# Patient Record
Sex: Female | Born: 1990 | Race: White | Hispanic: No | Marital: Single | Smoking: Current every day smoker
Health system: Southern US, Community
[De-identification: ages and names within clinical notes are randomized; demographics above are authoritative.]

## PROBLEM LIST (undated history)

## (undated) DIAGNOSIS — F419 Anxiety disorder, unspecified: Secondary | ICD-10-CM

## (undated) DIAGNOSIS — F32A Depression, unspecified: Secondary | ICD-10-CM

## (undated) DIAGNOSIS — F329 Major depressive disorder, single episode, unspecified: Secondary | ICD-10-CM

## (undated) HISTORY — DX: Major depressive disorder, single episode, unspecified: F32.9

## (undated) HISTORY — DX: Depression, unspecified: F32.A

## (undated) HISTORY — DX: Anxiety disorder, unspecified: F41.9

---

## 2010-11-06 ENCOUNTER — Ambulatory Visit (HOSPITAL_COMMUNITY)
Admission: RE | Admit: 2010-11-06 | Discharge: 2010-11-06 | Disposition: A | Discharge status: Home / Self Care | Payer: BC Managed Care – PPO | Source: Ambulatory Visit | Attending: Family Medicine | Admitting: Family Medicine

## 2010-11-06 ENCOUNTER — Other Ambulatory Visit: Payer: Self-pay | Admitting: Family Medicine

## 2010-11-06 DIAGNOSIS — R1031 Right lower quadrant pain: Secondary | ICD-10-CM | POA: Insufficient documentation

## 2010-11-06 MED ORDER — IOHEXOL 300 MG/ML  SOLN
100.0000 mL | Freq: Once | INTRAMUSCULAR | Status: AC | PRN
  Administered 2010-11-06: 100 mL via INTRAVENOUS

## 2012-05-08 IMAGING — CT CT ABD-PELV W/ CM
2 of 3 series · 17 of 46 positions shown, 19 images · IV contrast (Omnipaque 300)
Comparison: None

CLINICAL DATA: Right lower quadrant pain.

CT ABDOMEN AND PELVIS WITH CONTRAST
TECHNIQUE: Multidetector CT imaging of the abdomen and pelvis was
performed following the standard protocol during bolus
administration of intravenous contrast.
Contrast: 100mL OMNIPAQUE IOHEXOL 300 MG/ML IV SOLN

[Series 2: abd_pel_with 5.0 b40f · axial · 0.69mm/px · z∈[-481,-96]mm · 14 of 89 slices shown, 16 images]
[im 6/89  soft-tissue]
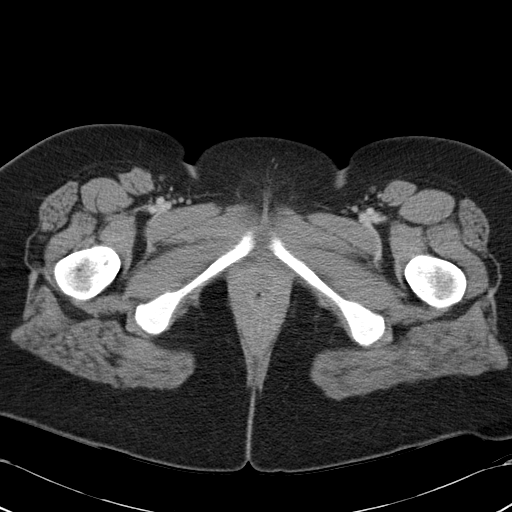
[im 6/89  bone]
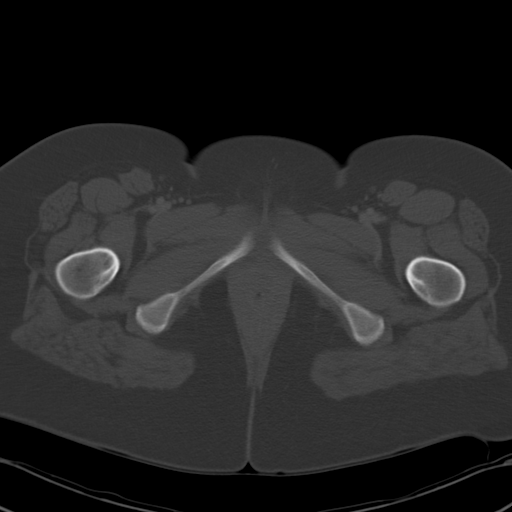
[im 12/89  soft-tissue]
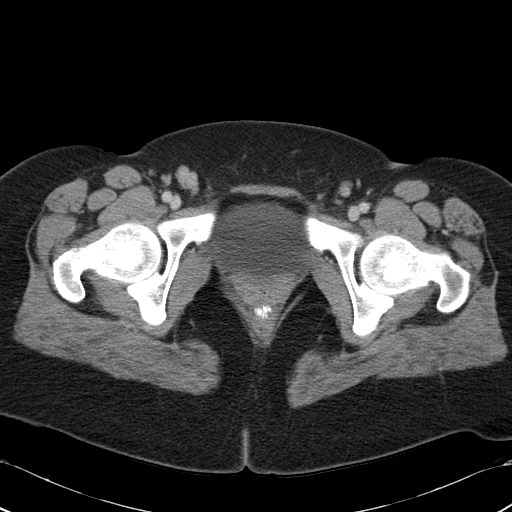
[im 18/89  soft-tissue]
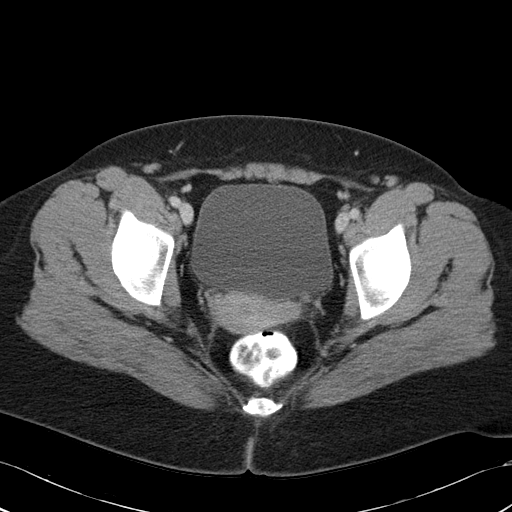
[im 23/89  soft-tissue]
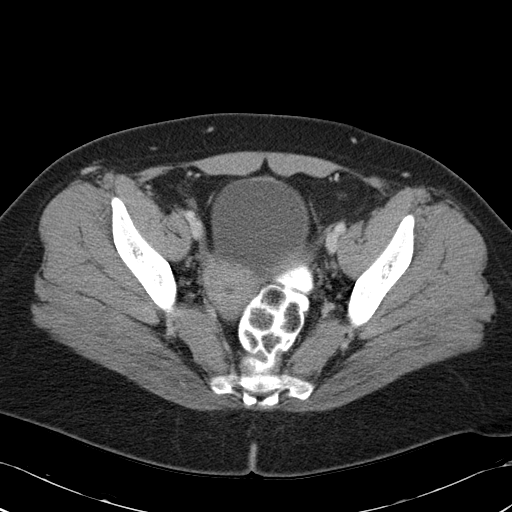
[im 29/89  soft-tissue]
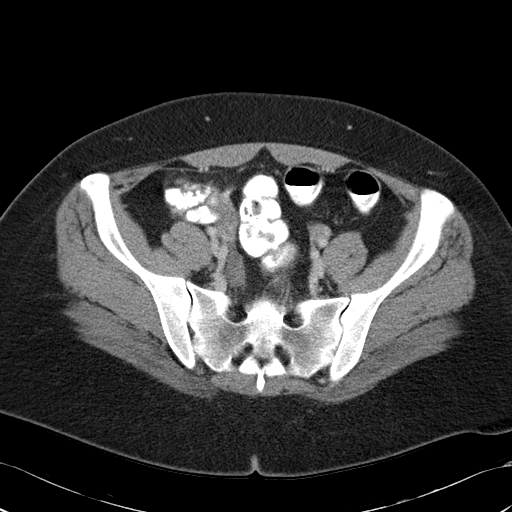
[im 35/89  soft-tissue]
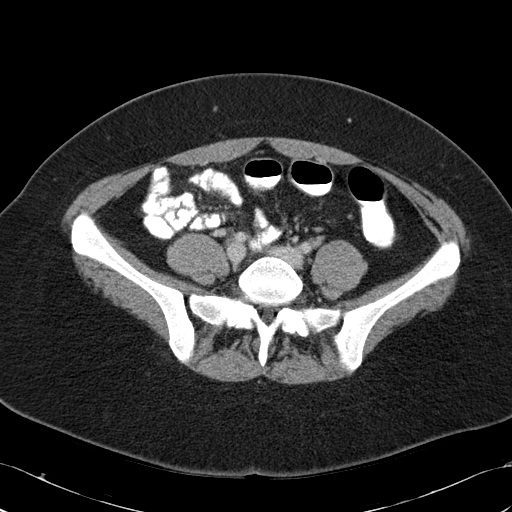
[im 40/89  soft-tissue]
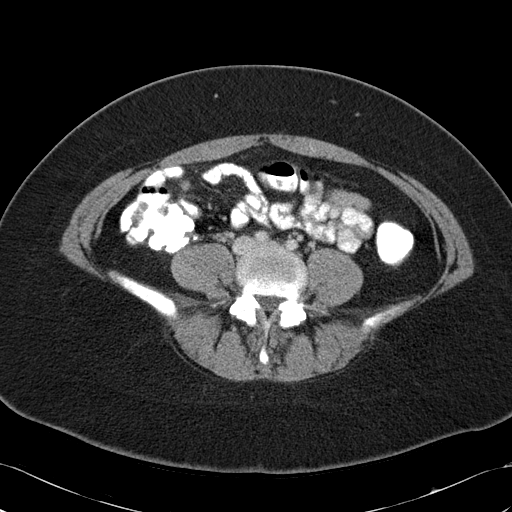
[im 49/89  soft-tissue]
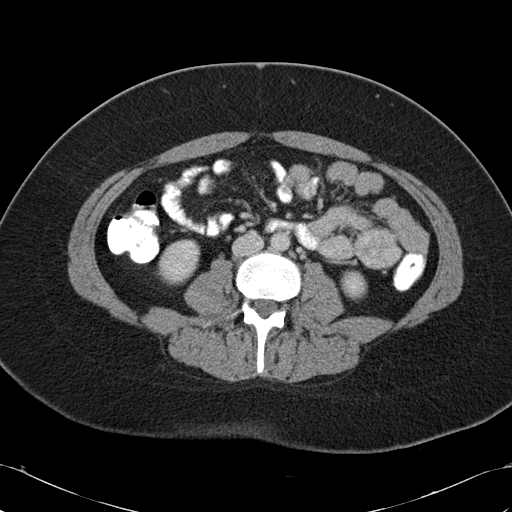
[im 54/89  soft-tissue]
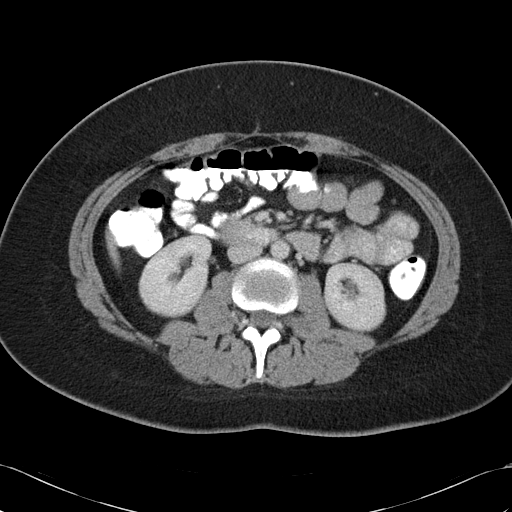
[im 54/89  bone]
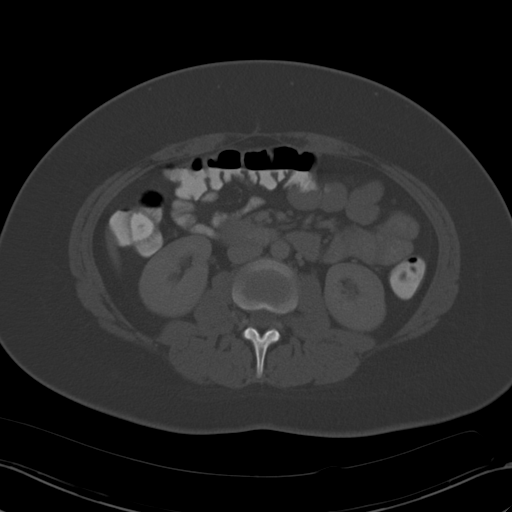
[im 60/89  soft-tissue]
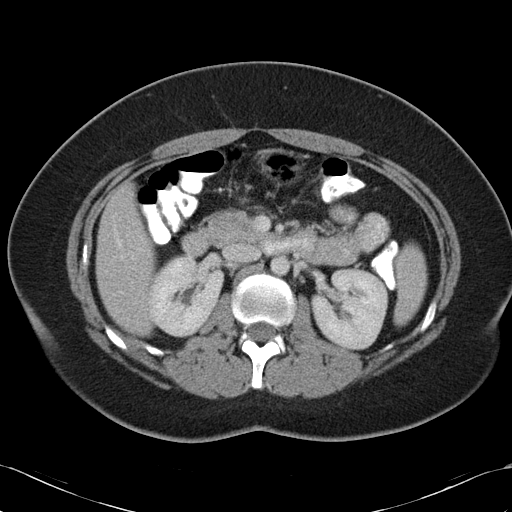
[im 66/89  soft-tissue]
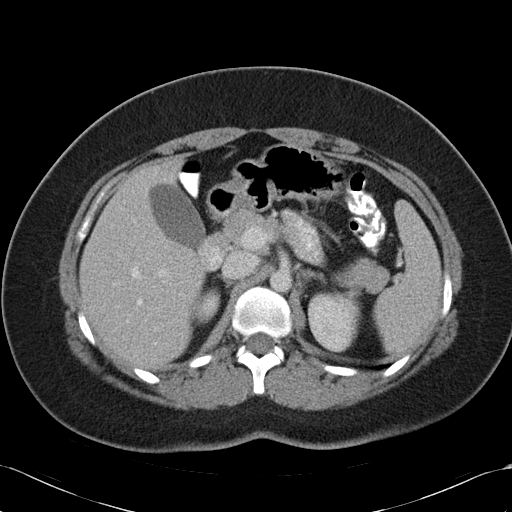
[im 71/89  soft-tissue]
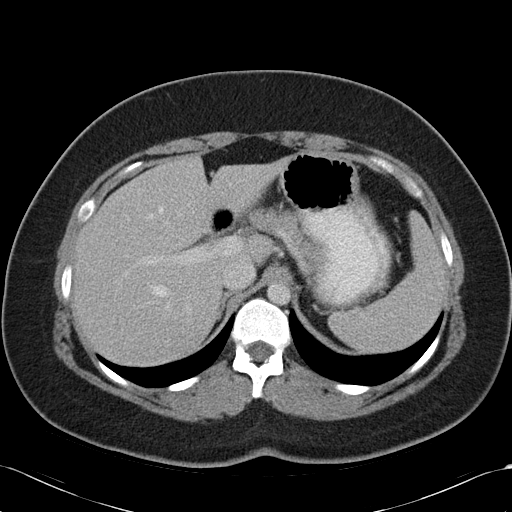
[im 77/89  soft-tissue]
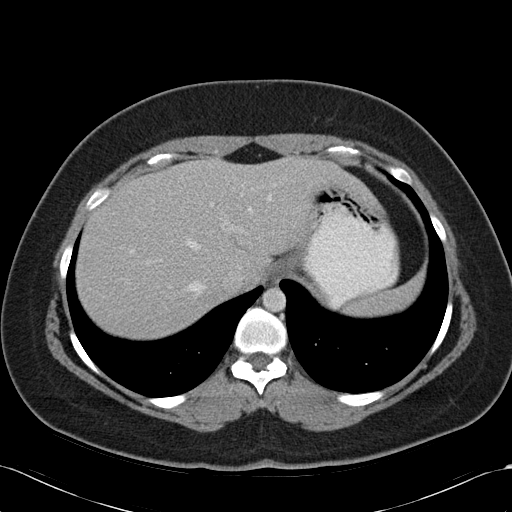
[im 83/89  soft-tissue]
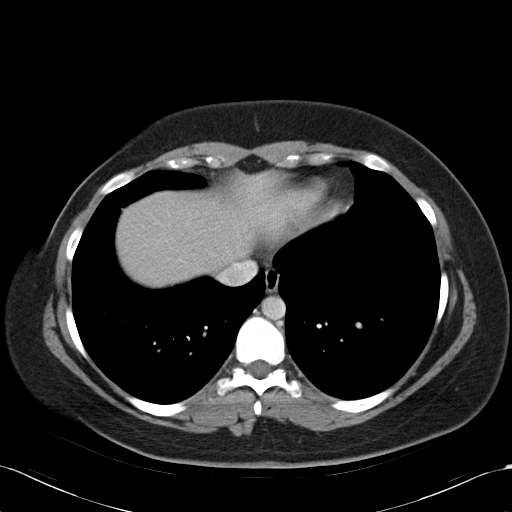

[Series 4: abd_pel_with 3.0 spo cor · coronal · 0.69mm/px · 3 of 77 slices shown]
[im 26/77  soft-tissue]
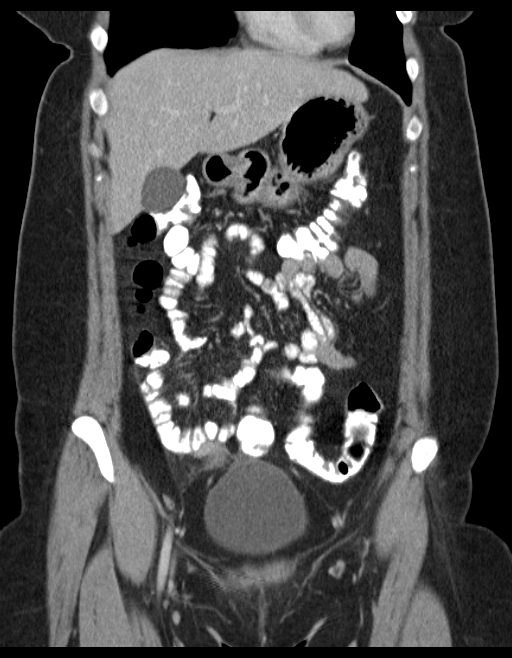
[im 34/77  soft-tissue]
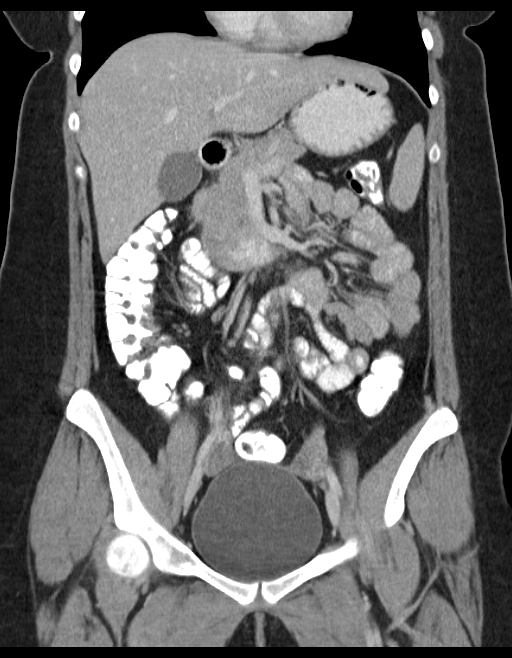
[im 43/77  soft-tissue]
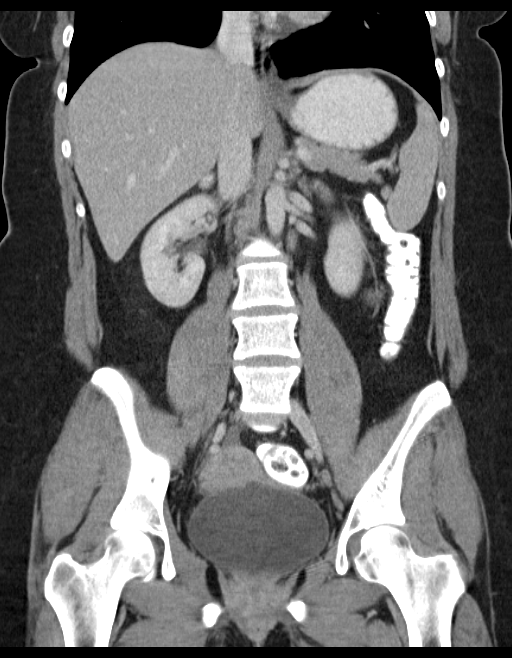

[17 of 46 positions shown; findings below may reference images not displayed]

FINDINGS: Lung bases are clear.  No effusions.  Heart is normal
size.

Liver, gallbladder, spleen, pancreas, adrenals and kidneys are
normal.  Appendix visualized and is normal.  Stomach, large and
small bowel are normal.  Uterus, adnexa urinary bladder grossly
unremarkable.  No free fluid, free air or adenopathy.  Aorta is
normal caliber.  No acute bony abnormality.
IMPRESSION: Normal appendix.

No acute findings in the abdomen or pelvis.

## 2012-06-05 ENCOUNTER — Other Ambulatory Visit: Payer: Self-pay | Admitting: Nurse Practitioner

## 2012-07-06 ENCOUNTER — Telehealth: Payer: Self-pay | Admitting: Nurse Practitioner

## 2012-07-11 ENCOUNTER — Other Ambulatory Visit: Payer: Self-pay | Admitting: *Deleted

## 2012-07-11 MED ORDER — CITALOPRAM HYDROBROMIDE 40 MG PO TABS: 40.0000 mg | ORAL_TABLET | Freq: Every day | ORAL | Status: DC

## 2012-07-11 NOTE — Telephone Encounter (Signed)
Done

## 2012-07-28 ENCOUNTER — Ambulatory Visit (INDEPENDENT_AMBULATORY_CARE_PROVIDER_SITE_OTHER): Payer: BC Managed Care – PPO | Admitting: General Practice

## 2012-07-28 ENCOUNTER — Encounter: Payer: Self-pay | Admitting: General Practice

## 2012-07-28 VITALS — BP 107/80 | HR 82 | Temp 98.8°F | Ht 62.0 in | Wt 166.0 lb

## 2012-07-28 DIAGNOSIS — F410 Panic disorder [episodic paroxysmal anxiety] without agoraphobia: Secondary | ICD-10-CM

## 2012-07-28 MED ORDER — CITALOPRAM HYDROBROMIDE 40 MG PO TABS: 40.0000 mg | ORAL_TABLET | Freq: Every day | ORAL | Status: DC

## 2012-07-28 NOTE — Progress Notes (Signed)
  Subjective:    Patient ID: Brooke Mcclure, female    DOB: December 28, 1990, 22 y.o.   MRN: [id number]  HPI Patient presents today for medications refill. She is taking celexa 40 mg daily. She reports her panic attacks have been well managed while taking this medications. Reports last menstrual cycle as two weeks ago.     Review of Systems  Constitutional: Negative for fever and chills.  Respiratory: Negative for chest tightness and shortness of breath.   Cardiovascular: Negative for chest pain and palpitations.  Gastrointestinal: Negative for nausea, vomiting, abdominal pain and blood in stool.  Genitourinary: Negative for difficulty urinating.  Neurological: Negative for dizziness, weakness and headaches.       Objective:   Physical Exam  Constitutional: She is oriented to person, place, and time. She appears well-developed and well-nourished.  HENT:  Head: Normocephalic and atraumatic.  Cardiovascular: Normal rate, regular rhythm and normal heart sounds.   Pulmonary/Chest: Effort normal and breath sounds normal.  Neurological: She is alert and oriented to person, place, and time.  Skin: Skin is warm and dry.  Psychiatric: She has a normal mood and affect.          Assessment & Plan:  1. Panic attacks - citalopram (CELEXA) 40 MG tablet; Take 1 tablet (40 mg total) by mouth daily.  Dispense: 30 tablet; Refill: 3 -discussed importance of identifying causes of panic attacks -RTO if symptoms develop -Patient verbalized understanding -Coralie Keens, FNP-C

## 2012-07-28 NOTE — Patient Instructions (Addendum)

## 2012-08-07 ENCOUNTER — Other Ambulatory Visit: Payer: Self-pay | Admitting: Family Medicine

## 2012-08-24 ENCOUNTER — Ambulatory Visit: Payer: Self-pay | Admitting: Family Medicine

## 2012-08-25 ENCOUNTER — Ambulatory Visit (INDEPENDENT_AMBULATORY_CARE_PROVIDER_SITE_OTHER): Payer: BC Managed Care – PPO | Admitting: Family Medicine

## 2012-08-25 ENCOUNTER — Encounter: Payer: Self-pay | Admitting: Family Medicine

## 2012-08-25 VITALS — BP 124/89 | HR 86 | Temp 99.5°F | Ht 62.0 in | Wt 167.8 lb

## 2012-08-25 DIAGNOSIS — E559 Vitamin D deficiency, unspecified: Secondary | ICD-10-CM

## 2012-08-25 DIAGNOSIS — R5381 Other malaise: Secondary | ICD-10-CM

## 2012-08-25 DIAGNOSIS — E538 Deficiency of other specified B group vitamins: Secondary | ICD-10-CM

## 2012-08-25 DIAGNOSIS — Z0000 Encounter for general adult medical examination without abnormal findings: Secondary | ICD-10-CM

## 2012-08-25 LAB — POCT CBC
Granulocyte percent: 73.5 %G (ref 37–80)
HCT, POC: 41.3 % (ref 37.7–47.9)
Hemoglobin: 14.2 g/dL (ref 12.2–16.2)
Lymph, poc: 1.5 (ref 0.6–3.4)
MCH, POC: 31.5 pg — AB (ref 27–31.2)
MCHC: 34.3 g/dL (ref 31.8–35.4)
MCV: 91.8 fL (ref 80–97)
MPV: 7.9 fL (ref 0–99.8)
POC Granulocyte: 4.7 (ref 2–6.9)
POC LYMPH PERCENT: 23.6 %L (ref 10–50)
Platelet Count, POC: 304 10*3/uL (ref 142–424)
RBC: 4.5 M/uL (ref 4.04–5.48)
RDW, POC: 12 %
WBC: 6.4 10*3/uL (ref 4.6–10.2)

## 2012-08-25 NOTE — Progress Notes (Signed)
  Subjective:    Patient ID: Brooke Mcclure, female    DOB: [birth year], 22 y.o.   MRN: [id number]  HPI This 22 y.o. female presents for evaluation of physical without pap smear.  She wants STD labs. She is a social drinker and smokes.  She has hx of GAD which is controlled with celexa.   She otherwise has no health concerns.   Review of Systems    No chest pain, SOB, HA, dizziness, vision change, N/V, diarrhea, constipation, dysuria, urinary urgency or frequency, myalgias, arthralgias or rash.  Objective:   Physical Exam Vital signs noted  Well developed well nourished female.  HEENT - Head atraumatic Normocephalic                Eyes - PERRLA, Conjuctiva - clear Sclera- Clear EOMI                Ears - EAC's Wnl TM's Wnl Gross Hearing WNL                Nose - Nares patent                 Throat - oropharanx wnl Respiratory - Lungs CTA bilateral Cardiac - RRR S1 and S2 without murmur GI - Abdomen soft Nontender and bowel sounds active x 4 Extremities - No edema. Neuro - Grossly intact.       Assessment & Plan:  Routine general medical examination at a health care facility - Plan: POCT CBC, CMP14+EGFR, Lipid panel, Thyroid Panel With TSH, HIV antibody, Vit D  25 hydroxy (rtn osteoporosis monitoring), Vitamin B12, RPR  B12 deficiency - Plan: Vitamin B12  Unspecified vitamin D deficiency - Plan: Vit D  25 hydroxy (rtn osteoporosis monitoring)  Other malaise and fatigue - Plan: POCT CBC, CMP14+EGFR, Lipid panel, Thyroid Panel With TSH

## 2012-08-25 NOTE — Patient Instructions (Signed)

## 2012-08-26 ENCOUNTER — Other Ambulatory Visit: Payer: Self-pay | Admitting: Family Medicine

## 2012-08-26 DIAGNOSIS — E538 Deficiency of other specified B group vitamins: Secondary | ICD-10-CM

## 2012-08-26 LAB — LIPID PANEL
Chol/HDL Ratio: 3.2 ratio units (ref 0.0–4.4)
Cholesterol, Total: 151 mg/dL (ref 100–189)
HDL: 47 mg/dL (ref >39)
LDL Calculated: 90 mg/dL (ref 0–119)
Triglycerides: 69 mg/dL (ref 0–114)
VLDL Cholesterol Cal: 14 mg/dL (ref 5–40)

## 2012-08-26 LAB — CMP14+EGFR
ALT: 12 IU/L (ref 0–32)
AST: 12 IU/L (ref 0–40)
Albumin/Globulin Ratio: 2 (ref 1.1–2.5)
Albumin: 4.7 g/dL (ref 3.5–5.5)
Alkaline Phosphatase: 80 IU/L (ref 39–117)
BUN/Creatinine Ratio: 15 (ref 8–20)
BUN: 12 mg/dL (ref 6–20)
CO2: 23 mmol/L (ref 18–29)
Calcium: 9.6 mg/dL (ref 8.7–10.2)
Chloride: 104 mmol/L (ref 97–108)
Creatinine, Ser: 0.79 mg/dL (ref 0.57–1.00)
GFR calc Af Amer: 121 mL/min/{1.73_m2} (ref >59)
GFR calc non Af Amer: 105 mL/min/{1.73_m2} (ref >59)
Globulin, Total: 2.3 g/dL (ref 1.5–4.5)
Glucose: 81 mg/dL (ref 65–99)
Potassium: 4.3 mmol/L (ref 3.5–5.2)
Sodium: 142 mmol/L (ref 134–144)
Total Bilirubin: 0.5 mg/dL (ref 0.0–1.2)
Total Protein: 7 g/dL (ref 6.0–8.5)

## 2012-08-26 LAB — THYROID PANEL WITH TSH
Free Thyroxine Index: 2.1 (ref 1.2–4.9)
T3 Uptake Ratio: 31 % (ref 24–39)
T4, Total: 6.7 ug/dL (ref 4.5–12.0)
TSH: 1.32 u[IU]/mL (ref 0.450–4.500)

## 2012-08-26 LAB — VITAMIN D 25 HYDROXY (VIT D DEFICIENCY, FRACTURES): Vit D, 25-Hydroxy: 28.7 ng/mL — ABNORMAL LOW (ref 30.0–100.0)

## 2012-08-26 LAB — HIV ANTIBODY (ROUTINE TESTING W REFLEX)
HIV 1/O/2 Abs-Index Value: <1 (ref <1.00)
HIV-1/HIV-2 Ab: NONREACTIVE

## 2012-08-26 LAB — RPR: RPR: NONREACTIVE

## 2012-08-26 LAB — VITAMIN B12: Vitamin B-12: 293 pg/mL (ref 211–946)

## 2012-08-26 MED ORDER — CYANOCOBALAMIN 1000 MCG/ML IJ SOLN: INTRAMUSCULAR | Status: DC

## 2012-12-05 ENCOUNTER — Other Ambulatory Visit: Payer: Self-pay

## 2012-12-05 DIAGNOSIS — F410 Panic disorder [episodic paroxysmal anxiety] without agoraphobia: Secondary | ICD-10-CM

## 2012-12-05 MED ORDER — CITALOPRAM HYDROBROMIDE 40 MG PO TABS: 40.0000 mg | ORAL_TABLET | Freq: Every day | ORAL | Status: DC

## 2012-12-05 NOTE — Telephone Encounter (Signed)
Last seen 08/25/12  B [place]

## 2013-03-08 ENCOUNTER — Ambulatory Visit (INDEPENDENT_AMBULATORY_CARE_PROVIDER_SITE_OTHER): Payer: BC Managed Care – PPO | Admitting: Nurse Practitioner

## 2013-03-08 ENCOUNTER — Encounter: Payer: Self-pay | Admitting: Nurse Practitioner

## 2013-03-08 VITALS — BP 115/78 | HR 84 | Temp 97.4°F | Ht 62.0 in | Wt 164.0 lb

## 2013-03-08 DIAGNOSIS — A084 Viral intestinal infection, unspecified: Secondary | ICD-10-CM

## 2013-03-08 DIAGNOSIS — A088 Other specified intestinal infections: Secondary | ICD-10-CM

## 2013-03-08 DIAGNOSIS — R109 Unspecified abdominal pain: Secondary | ICD-10-CM

## 2013-03-08 LAB — POCT CBC
GRANULOCYTE PERCENT: 70.8 % (ref 37–80)
HCT, POC: 39.3 % (ref 37.7–47.9)
HEMOGLOBIN: 12.9 g/dL (ref 12.2–16.2)
Lymph, poc: 1.5 (ref 0.6–3.4)
MCH, POC: 29.8 pg (ref 27–31.2)
MCHC: 32.9 g/dL (ref 31.8–35.4)
MCV: 90.5 fL (ref 80–97)
MPV: 7.8 fL (ref 0–99.8)
POC GRANULOCYTE: 4.5 (ref 2–6.9)
POC LYMPH %: 23.7 % (ref 10–50)
Platelet Count, POC: 247 10*3/uL (ref 142–424)
RBC: 4.3 M/uL (ref 4.04–5.48)
RDW, POC: 11.6 %
WBC: 6.3 10*3/uL (ref 4.6–10.2)

## 2013-03-08 NOTE — Progress Notes (Signed)
   Subjective:    Patient ID: Kendal HymenGabrielle Checca, female    DOB: [birth year], 23 y.o.   MRN: [id number]  HPI Patient presents today with stomach complaints. Symptoms began a few days ago and include diarrhea, nausea, vomiting, chills, dizziness, body aches, and stomach pain. Patient states appetite has decreased and goes immediately to the restroom once she eats. Treatment include Tylenol which provided moderate relief of pain. Denies urinary symptoms.    Review of Systems  Constitutional: Positive for fever, chills and appetite change.  Respiratory: Negative for shortness of breath.   Cardiovascular: Negative for chest pain.  Gastrointestinal: Positive for nausea, vomiting, abdominal pain and diarrhea. Negative for constipation.  Genitourinary: Negative.   Neurological: Positive for dizziness and light-headedness.       Objective:   Physical Exam  Constitutional: She appears well-developed and well-nourished.  Cardiovascular: Normal rate, regular rhythm and normal heart sounds.   Pulmonary/Chest: Effort normal and breath sounds normal.  Abdominal: Soft. Bowel sounds are normal. There is tenderness (Lower right and left quadrants).    BP 115/78  Pulse 84  Temp(Src) 97.4 F (36.3 C) (Oral)  Ht 5\' 2"  (1.575 m)  Wt 164 lb (74.39 kg)  BMI 29.99 kg/m2  Results for orders placed in visit on 03/08/13  POCT CBC      Result Value Ref Range   WBC 6.3  4.6 - 10.2 K/uL   Lymph, poc 1.5  0.6 - 3.4   POC LYMPH PERCENT 23.7  10 - 50 %L   POC Granulocyte 4.5  2 - 6.9   Granulocyte percent 70.8  37 - 80 %G   RBC 4.3  4.04 - 5.48 M/uL   Hemoglobin 12.9  12.2 - 16.2 g/dL   HCT, POC 13.239.3  44.037.7 - 47.9 %   MCV 90.5  80 - 97 fL   MCH, POC 29.8  27 - 31.2 pg   MCHC 32.9  31.8 - 35.4 g/dL   RDW, POC 10.211.6     Platelet Count, POC 247.0  142 - 424 K/uL   MPV 7.8  0 - 99.8 fL     Assessment & Plan:   1. Viral gastroenteritis   2. Abdominal pain, unspecified site    First 24 Hours-Clear  liquids  popsicles  Jello  gatorade  Sprite Second 24 hours-Add Full liquids ( Liquids you cant see through) Third 24 hours- Bland diet ( foods that are baked or broiled)  *avoiding fried foods and highly spiced foods* During these 3 days  Avoid milk, cheese, ice cream or any other dairy products  Avoid caffeine- REMEMBER Mt. Dew and Mello Yellow contain lots of caffeine You should eat and drink in  Frequent small volumes If no improvement in symptoms or worsen in 2-3 days should RETRUN TO OFFICE or go to ER!    Mary-Margaret Daphine DeutscherMartin, FNP

## 2013-03-08 NOTE — Patient Instructions (Signed)
First 24 Hours-Clear liquids  popsicles  Jello  gatorade  Sprite Second 24 hours-Add Full liquids ( Liquids you cant see through) Third 24 hours- Bland diet ( foods that are baked or broiled)  *avoiding fried foods and highly spiced foods* During these 3 days  Avoid milk, cheese, ice cream or any other dairy products  Avoid caffeine- REMEMBER Mt. Dew and Mello Yellow contain lots of caffeine Imodium AD OTC  You should eat and drink in  Frequent small volumes If no improvement in symptoms or worsen in 2-3 days should RETRUN TO OFFICE or go to ER!

## 2013-06-05 ENCOUNTER — Telehealth: Payer: Self-pay | Admitting: Nurse Practitioner

## 2013-06-05 NOTE — Telephone Encounter (Signed)
Patient was calling to check and see if she could take slim quick with her celexa. It recommends on the bottle not to use with a SSRI. I advised patient not to use and the pharmacist recommended the same thing yesterday.

## 2014-01-21 ENCOUNTER — Encounter: Payer: Self-pay | Admitting: Nurse Practitioner

## 2014-01-21 ENCOUNTER — Ambulatory Visit (INDEPENDENT_AMBULATORY_CARE_PROVIDER_SITE_OTHER): Payer: BLUE CROSS/BLUE SHIELD | Admitting: Nurse Practitioner

## 2014-01-21 VITALS — BP 126/86 | HR 87 | Temp 97.0°F | Ht 62.0 in | Wt 172.0 lb

## 2014-01-21 DIAGNOSIS — F32A Depression, unspecified: Secondary | ICD-10-CM

## 2014-01-21 DIAGNOSIS — F329 Major depressive disorder, single episode, unspecified: Secondary | ICD-10-CM

## 2014-01-21 MED ORDER — ESCITALOPRAM OXALATE 20 MG PO TABS: 20.0000 mg | ORAL_TABLET | Freq: Every day | ORAL | Status: DC

## 2014-01-21 NOTE — Patient Instructions (Signed)
Stress and Stress Management Stress is a normal reaction to life events. It is what you feel when life demands more than you are used to or more than you can handle. Some stress can be useful. For example, the stress reaction can help you catch the last bus of the day, study for a test, or meet a deadline at work. But stress that occurs too often or for too long can cause problems. It can affect your emotional health and interfere with relationships and normal daily activities. Too much stress can weaken your immune system and increase your risk for physical illness. If you already have a medical problem, stress can make it worse. CAUSES  All sorts of life events may cause stress. An event that causes stress for one person may not be stressful for another person. Major life events commonly cause stress. These may be positive or negative. Examples include losing your job, moving into a new home, getting married, having a baby, or losing a loved one. Less obvious life events may also cause stress, especially if they occur day after day or in combination. Examples include working long hours, driving in traffic, caring for children, being in debt, or being in a difficult relationship. SIGNS AND SYMPTOMS Stress may cause emotional symptoms including, the following:  Anxiety. This is feeling worried, afraid, on edge, overwhelmed, or out of control.  Anger. This is feeling irritated or impatient.  Depression. This is feeling sad, down, helpless, or guilty.  Difficulty focusing, remembering, or making decisions. Stress may cause physical symptoms, including the following:   Aches and pains. These may affect your head, neck, back, stomach, or other areas of your body.  Tight muscles or clenched jaw.  Low energy or trouble sleeping. Stress may cause unhealthy behaviors, including the following:   Eating to feel better (overeating) or skipping meals.  Sleeping too little, too much, or both.  Working  too much or putting off tasks (procrastination).  Smoking, drinking alcohol, or using drugs to feel better. DIAGNOSIS  Stress is diagnosed through an assessment by your health care provider. Your health care provider will ask questions about your symptoms and any stressful life events.Your health care provider will also ask about your medical history and may order blood tests or other tests. Certain medical conditions and medicine can cause physical symptoms similar to stress. Mental illness can cause emotional symptoms and unhealthy behaviors similar to stress. Your health care provider may refer you to a mental health professional for further evaluation.  TREATMENT  Stress management is the recommended treatment for stress.The goals of stress management are reducing stressful life events and coping with stress in healthy ways.  Techniques for reducing stressful life events include the following:  Stress identification. Self-monitor for stress and identify what causes stress for you. These skills may help you to avoid some stressful events.  Time management. Set your priorities, keep a calendar of events, and learn to say "no." These tools can help you avoid making too many commitments. Techniques for coping with stress include the following:  Rethinking the problem. Try to think realistically about stressful events rather than ignoring them or overreacting. Try to find the positives in a stressful situation rather than focusing on the negatives.  Exercise. Physical exercise can release both physical and emotional tension. The key is to find a form of exercise you enjoy and do it regularly.  Relaxation techniques. These relax the body and mind. Examples include yoga, meditation, tai chi, biofeedback, deep  breathing, progressive muscle relaxation, listening to music, being out in nature, journaling, and other hobbies. Again, the key is to find one or more that you enjoy and can do  regularly.  Healthy lifestyle. Eat a balanced diet, get plenty of sleep, and do not smoke. Avoid using alcohol or drugs to relax.  Strong support network. Spend time with family, friends, or other people you enjoy being around.Express your feelings and talk things over with someone you trust. Counseling or talktherapy with a mental health professional may be helpful if you are having difficulty managing stress on your own. Medicine is typically not recommended for the treatment of stress.Talk to your health care provider if you think you need medicine for symptoms of stress. HOME CARE INSTRUCTIONS  Keep all follow-up visits as directed by your health care provider.  Take all medicines as directed by your health care provider. SEEK MEDICAL CARE IF:  Your symptoms get worse or you start having new symptoms.  You feel overwhelmed by your problems and can no longer manage them on your own. SEEK IMMEDIATE MEDICAL CARE IF:  You feel like hurting yourself or someone else. Document Released: 06/16/2000 Document Revised: 05/07/2013 Document Reviewed: 08/15/2012 Exit[employer] Patient Information 2015 [employer]. This information is not intended to replace advice given to you by your health care provider. Make sure you discuss any questions you have with your health care provider.  

## 2014-01-21 NOTE — Progress Notes (Signed)
   Subjective:    Patient ID: Brooke Mcclure, female    DOB: 02-Jan-1991, [age] y.o.   MRN: [id number]  HPI Patient in today for recheck of meds. SHe is currently on celexa for depressive disorder. Patient has been on this for 3 years and says that she is starting to feel depressed again. Would like to try something else- Has been on zoloft  And wellbutrin in the past which did not help.    Review of Systems  Constitutional: Negative.   HENT: Negative.   Respiratory: Negative.   Cardiovascular: Negative.   Gastrointestinal: Negative.   Genitourinary: Negative.   Neurological: Negative.   Psychiatric/Behavioral: Negative.   All other systems reviewed and are negative.      Objective:   Physical Exam  Constitutional: She is oriented to person, place, and time. She appears well-developed and well-nourished.  Cardiovascular: Normal rate, regular rhythm and normal heart sounds.   Pulmonary/Chest: Effort normal and breath sounds normal.  Neurological: She is alert and oriented to person, place, and time.  Skin: Skin is warm and dry.  Psychiatric: She has a normal mood and affect. Her behavior is normal. Judgment and thought content normal.   BP 126/86 mmHg  Pulse 87  Temp(Src) 97 F (36.1 C) (Oral)  Ht 5\' 2"  (1.575 m)  Wt 172 lb (78.019 kg)  BMI 31.45 kg/m2        Assessment & Plan:   1. Depression    Meds ordered this encounter  Medications  . escitalopram (LEXAPRO) 20 MG tablet    Sig: Take 1 tablet (20 mg total) by mouth daily.    Dispense:  30 tablet    Refill:  1    Order Specific Question:  Supervising Provider    Answer:  Ernestina PennaMOORE, DONALD W [[id number]]  side effects reviewed Stress management rto in 3 weeks follow up   Brooke Daphine DeutscherMartin, FNP

## 2014-02-11 ENCOUNTER — Ambulatory Visit (INDEPENDENT_AMBULATORY_CARE_PROVIDER_SITE_OTHER): Payer: BLUE CROSS/BLUE SHIELD | Admitting: Nurse Practitioner

## 2014-02-11 ENCOUNTER — Encounter: Payer: Self-pay | Admitting: Nurse Practitioner

## 2014-02-11 VITALS — BP 116/80 | HR 81 | Temp 98.5°F | Ht 62.0 in | Wt 169.0 lb

## 2014-02-11 DIAGNOSIS — F32A Depression, unspecified: Secondary | ICD-10-CM | POA: Insufficient documentation

## 2014-02-11 DIAGNOSIS — F411 Generalized anxiety disorder: Secondary | ICD-10-CM

## 2014-02-11 DIAGNOSIS — F329 Major depressive disorder, single episode, unspecified: Secondary | ICD-10-CM | POA: Insufficient documentation

## 2014-02-11 DIAGNOSIS — R5383 Other fatigue: Secondary | ICD-10-CM

## 2014-02-11 MED ORDER — VILAZODONE HCL 20 MG PO TABS: 1.0000 | ORAL_TABLET | Freq: Every day | ORAL | Status: DC

## 2014-02-11 NOTE — Progress Notes (Signed)
° °  Subjective:    Patient ID: Brooke Mcclure, female    DOB: January 24, 1990, 24 y.o.   MRN: [id number]  HPI Patient is here today for following on antidepression medication. She was started on lexapro20mg  on 01/21/14. She reports feeling the same. No improvement with the new medication. She reports feeling fatigue. Denies decrease in appetite, reports good sleeping habits.    Review of Systems  Constitutional: Negative.   HENT: Negative.   Eyes: Negative.   Respiratory: Negative.   Cardiovascular: Negative.   Gastrointestinal: Negative.   Endocrine: Negative.   Genitourinary: Negative.   Musculoskeletal: Negative.   Allergic/Immunologic: Negative.   Neurological: Negative.   Psychiatric/Behavioral:       Fatigue, depressed.   All other systems reviewed and are negative.      Objective:   Physical Exam  Constitutional: She is oriented to person, place, and time. She appears well-developed and well-nourished.  HENT:  Head: Normocephalic and atraumatic.  Eyes: Pupils are equal, round, and reactive to light.  Neck: Normal range of motion.  Cardiovascular: Normal rate and regular rhythm.   Pulmonary/Chest: Effort normal.  Abdominal: Soft.  Musculoskeletal: Normal range of motion.  Neurological: She is alert and oriented to person, place, and time.  Skin: Skin is warm.  Psychiatric: She has a normal mood and affect.    BP 116/80 mmHg   Pulse 81   Temp(Src) 98.5 F (36.9 C) (Oral)   Ht 5\' 2"  (1.575 m)   Wt 169 lb (76.658 kg)   BMI 30.90 kg/m2       Assessment & Plan:   1. Other fatigue   2. Depression   3. Anxiety, generalized    Orders Placed This Encounter  Procedures   Thyroid Panel With TSH   Meds ordered this encounter  Medications   Vilazodone HCl (VIIBRYD) 20 MG TABS    Sig: Take 1 tablet (20 mg total) by mouth daily.    Dispense:  30 tablet    Refill:  0    Order Specific Question:  Supervising Provider    Answer:  Deborra MedinaMOORE, DONALD W [[id number]]     Stress  management Encouraged to journal Follow up in 3 weeks PRN   Mary-Margaret Daphine DeutscherMartin, FNP

## 2014-02-11 NOTE — Patient Instructions (Signed)
Generalized Anxiety Disorder Generalized anxiety disorder (GAD) is a mental disorder. It interferes with life functions, including relationships, work, and school. GAD is different from normal anxiety, which everyone experiences at some point in their lives in response to specific life events and activities. Normal anxiety actually helps us prepare for and get through these life events and activities. Normal anxiety goes away after the event or activity is over.  GAD causes anxiety that is not necessarily related to specific events or activities. It also causes excess anxiety in proportion to specific events or activities. The anxiety associated with GAD is also difficult to control. GAD can vary from mild to severe. People with severe GAD can have intense waves of anxiety with physical symptoms (panic attacks).  SYMPTOMS The anxiety and worry associated with GAD are difficult to control. This anxiety and worry are related to many life events and activities and also occur more days than not for 6 months or longer. People with GAD also have three or more of the following symptoms (one or more in children):  Restlessness.   Fatigue.  Difficulty concentrating.   Irritability.  Muscle tension.  Difficulty sleeping or unsatisfying sleep. DIAGNOSIS GAD is diagnosed through an assessment by your health care provider. Your health care provider will ask you questions aboutyour mood,physical symptoms, and events in your life. Your health care provider may ask you about your medical history and use of alcohol or drugs, including prescription medicines. Your health care provider may also do a physical exam and blood tests. Certain medical conditions and the use of certain substances can cause symptoms similar to those associated with GAD. Your health care provider may refer you to a mental health specialist for further evaluation. TREATMENT The following therapies are usually used to treat GAD:    Medication. Antidepressant medication usually is prescribed for long-term daily control. Antianxiety medicines may be added in severe cases, especially when panic attacks occur.   Talk therapy (psychotherapy). Certain types of talk therapy can be helpful in treating GAD by providing support, education, and guidance. A form of talk therapy called cognitive behavioral therapy can teach you healthy ways to think about and react to daily life events and activities.  Stress managementtechniques. These include yoga, meditation, and exercise and can be very helpful when they are practiced regularly. A mental health specialist can help determine which treatment is best for you. Some people see improvement with one therapy. However, other people require a combination of therapies. Document Released: 04/17/2012 Document Revised: 05/07/2013 Document Reviewed: 04/17/2012 Exit[employer] Patient Information 2015 [employer]. This information is not intended to replace advice given to you by your health care provider. Make sure you discuss any questions you have with your health care provider.  

## 2014-03-06 ENCOUNTER — Ambulatory Visit: Payer: BLUE CROSS/BLUE SHIELD | Admitting: Nurse Practitioner

## 2014-03-24 ENCOUNTER — Other Ambulatory Visit: Payer: Self-pay | Admitting: Nurse Practitioner

## 2014-05-25 ENCOUNTER — Other Ambulatory Visit: Payer: Self-pay | Admitting: Nurse Practitioner

## 2014-06-24 ENCOUNTER — Other Ambulatory Visit: Payer: Self-pay | Admitting: Nurse Practitioner

## 2014-06-24 NOTE — Telephone Encounter (Signed)
Last seen 02/11/14  MMM 

## 2014-07-27 ENCOUNTER — Other Ambulatory Visit: Payer: Self-pay | Admitting: Nurse Practitioner

## 2014-08-28 ENCOUNTER — Other Ambulatory Visit: Payer: Self-pay | Admitting: Nurse Practitioner

## 2014-08-28 NOTE — Telephone Encounter (Signed)
Last seen 02/11/14  MMM 

## 2014-09-29 ENCOUNTER — Other Ambulatory Visit: Payer: Self-pay | Admitting: Nurse Practitioner

## 2014-09-30 ENCOUNTER — Telehealth: Payer: Self-pay | Admitting: Nurse Practitioner

## 2014-09-30 MED ORDER — ESCITALOPRAM OXALATE 20 MG PO TABS: ORAL_TABLET | ORAL | Status: DC

## 2014-09-30 NOTE — Telephone Encounter (Signed)
done

## 2014-10-10 ENCOUNTER — Ambulatory Visit: Payer: BLUE CROSS/BLUE SHIELD | Admitting: Nurse Practitioner

## 2014-10-15 ENCOUNTER — Ambulatory Visit (INDEPENDENT_AMBULATORY_CARE_PROVIDER_SITE_OTHER): Payer: BLUE CROSS/BLUE SHIELD | Admitting: Family Medicine

## 2014-10-15 ENCOUNTER — Encounter: Payer: Self-pay | Admitting: Family Medicine

## 2014-10-15 VITALS — BP 123/79 | HR 84 | Temp 98.0°F | Ht 62.0 in | Wt 167.4 lb

## 2014-10-15 DIAGNOSIS — F329 Major depressive disorder, single episode, unspecified: Secondary | ICD-10-CM | POA: Diagnosis not present

## 2014-10-15 DIAGNOSIS — H532 Diplopia: Secondary | ICD-10-CM

## 2014-10-15 DIAGNOSIS — F411 Generalized anxiety disorder: Secondary | ICD-10-CM

## 2014-10-15 DIAGNOSIS — F32A Depression, unspecified: Secondary | ICD-10-CM

## 2014-10-15 MED ORDER — ESCITALOPRAM OXALATE 20 MG PO TABS: ORAL_TABLET | ORAL | Status: AC

## 2014-10-15 NOTE — Progress Notes (Signed)
   HPI  Patient presents today for evaluation of depression and anxiety.  She states that she's tried several medications including Zoloft,Vybrid, celexa, and wellbutrin without improvement or with negative side effects  She denies SI, She sleeps well and has + fam Hx for depression and anxiety.  She states that her mood is been pretty well controlled until about 1 month ago when it seemed to get more anxious due to her wedding.  She also notes a head cold with some congestion that is improving. She reports diplopia intermittently over the last week or so. She states it is currently happening  PMH: Smoking status noted ROS: Per HPI  Objective: BP 123/79 mmHg  Pulse 84  Temp(Src) 98 F (36.7 C) (Oral)  Ht  (1.575 m)  Wt 167 lb 6.4 oz (75.932 kg)  BMI 30.61 kg/m2 Gen: NAD, alert, cooperative with exam HEENT: NCAT, no sinus tenderness, EOMI CV: RRR, good S1/S2, no murmur Resp: CTABL, no wheezes, non-labored Ext: No edema, warm Neuro: Alert and oriented, strength 5/5 and sensation intact in all 4 extremities, 2+ patellar tendon reflexes, EOMI, normal gait  Mood screening tools:  PHQ-9 score: 24, No SI GAD-7 Score: 21   Assessment and plan:  # Mood disorder, mixed anxiety and depression Continue Lexapro for now, if no improvement within the month would consider change to Effexor. No SI  # Diplopia Unclear etiology, hernial exam is normal and she has no extraocular movement abnormalities. No headache or other red flags for serious neurologic etiology Recommended watch and wait for another week, this is not improved or if it worsens, or if she develops signs of stroke which I detailed to her, then she should return or seek medical attention as appropriate.   Meds ordered this encounter  Medications  . escitalopram (LEXAPRO) 20 MG tablet    Sig: TAKE 1 TABLET (20 MG TOTAL) BY MOUTH DAILY.    Dispense:  30 tablet    Refill:  5    Murtis Sink, MD [place]  [facility] 10/15/2014, 5:11 PM

## 2014-10-15 NOTE — Patient Instructions (Signed)
Great to meet you!  Come back in 2 months to dsiscuss mood  If you develop sudden severe headache, weakness on one side of the body or the other, difficulty walking, or dificulty talking please seek immediate emergency care  If your double vision doesn't resolve within a week, please come back.

## 2014-10-19 ENCOUNTER — Telehealth: Payer: Self-pay | Admitting: Pediatrics

## 2014-10-19 NOTE — Telephone Encounter (Signed)
Called about wanting to stop lexapro but wanting to do it safely. Has been on multiple medicines for depression/anxiety in the past. For past week has been crying a lot more than usual, just doesn't feel like her normal self. Has been on the lexapro for several months. She does feel safe at home, has not had any thoughts of hurting herself or anyone else. A friend is coming to stay with her tonight. She is wondering if coming completely off of lexapro and doing counseling alone might help. Recommended she start cutting her lexapro 20mg  tablet in half, taking 10 mg daily until she can be seen in clinic this week. She will call back for an appointment on Monday with Gennette PacMary Margaret or Dr. Ermalinda MemosBradshaw as she has seen both of them before.

## 2014-10-26 ENCOUNTER — Other Ambulatory Visit: Payer: Self-pay | Admitting: Family Medicine

## 2014-10-28 ENCOUNTER — Other Ambulatory Visit: Payer: Self-pay | Admitting: Family Medicine

## 2014-12-09 ENCOUNTER — Telehealth: Payer: Self-pay | Admitting: Nurse Practitioner

## 2020-02-04 ENCOUNTER — Other Ambulatory Visit: Payer: Self-pay | Admitting: Obstetrics

## 2020-02-04 DIAGNOSIS — N939 Abnormal uterine and vaginal bleeding, unspecified: Secondary | ICD-10-CM

## 2020-02-15 ENCOUNTER — Ambulatory Visit
Admission: RE | Admit: 2020-02-15 | Discharge: 2020-02-15 | Disposition: A | Discharge status: Home / Self Care | Payer: BC Managed Care – PPO | Source: Ambulatory Visit | Attending: Obstetrics | Admitting: Obstetrics

## 2020-02-15 DIAGNOSIS — N939 Abnormal uterine and vaginal bleeding, unspecified: Secondary | ICD-10-CM

## 2021-08-17 IMAGING — US US PELVIS COMPLETE WITH TRANSVAGINAL
1 series · 14 of 25 positions shown · non-contrast
Comparison: None

CLINICAL DATA: Abnormal uterine bleeding



[Series 1: us pelvis complete with transvaginal · 0.20mm/px · 14 of 69 slices shown]
[im 1/69]
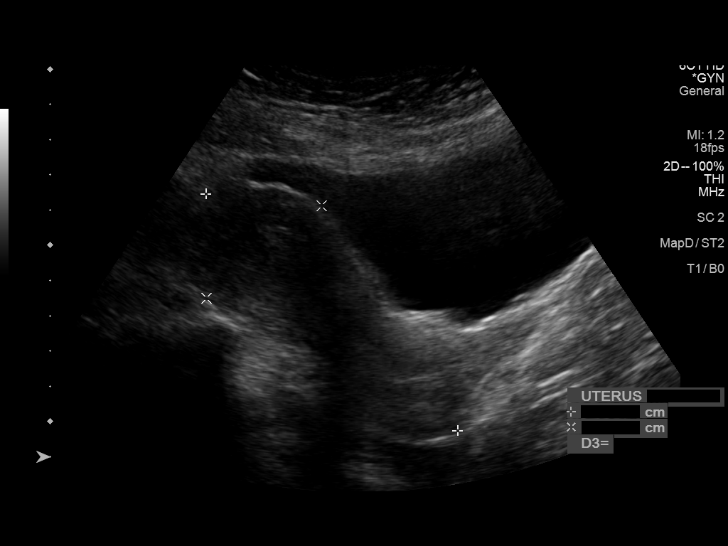
[im 6/69]
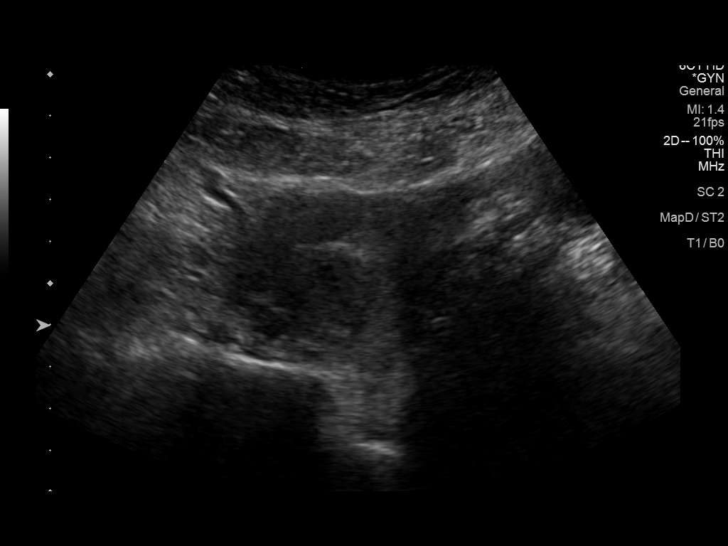
[im 12/69]
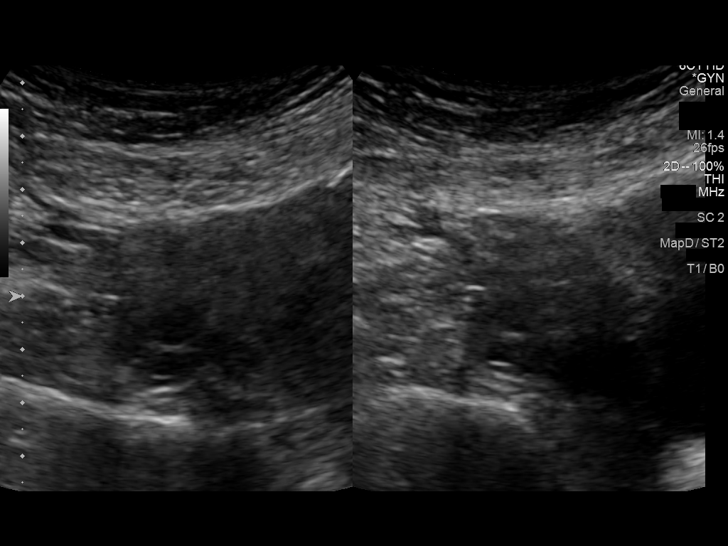
[im 18/69]
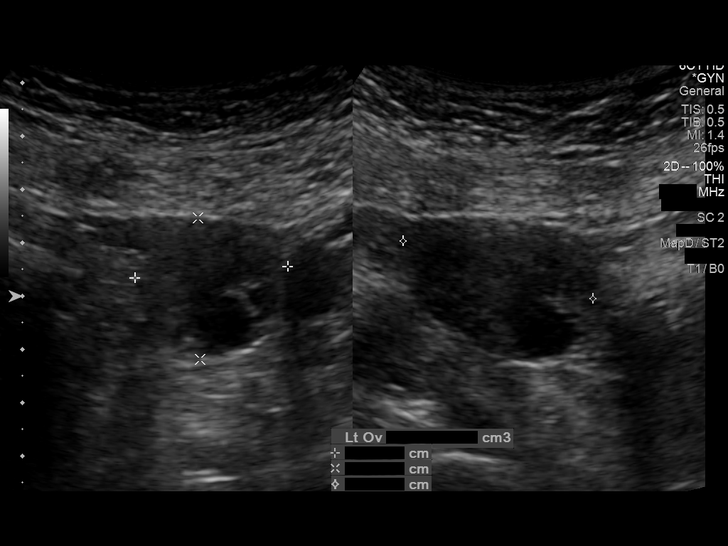
[im 23/69]
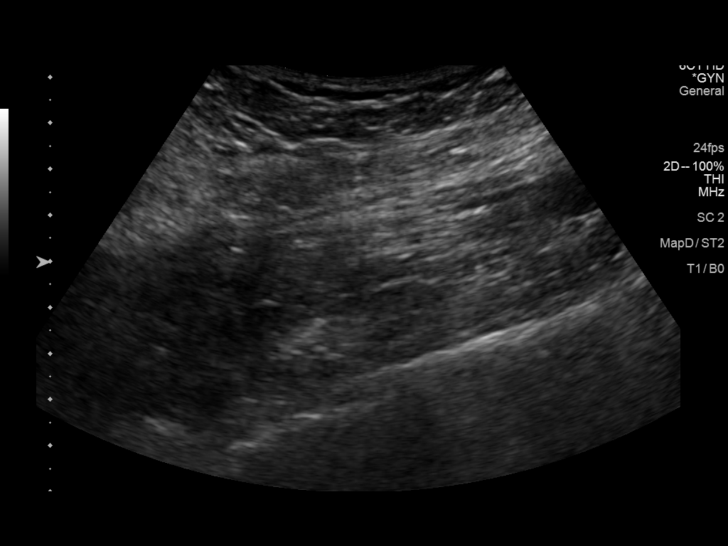
[im 26/69]
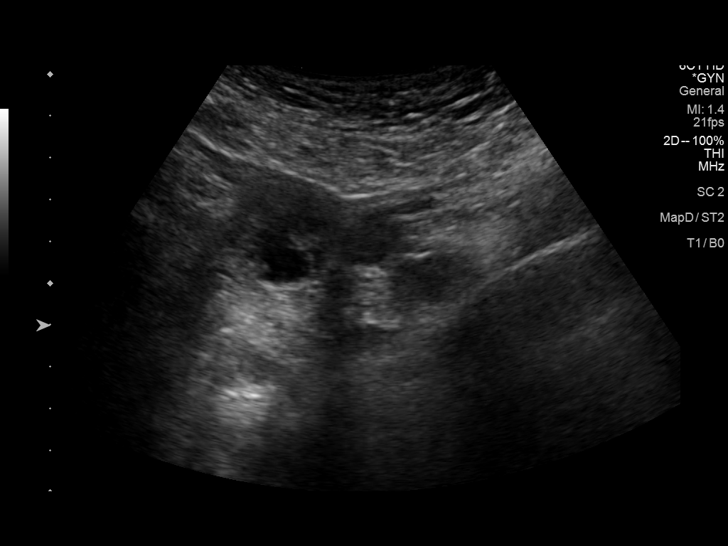
[im 32/69]
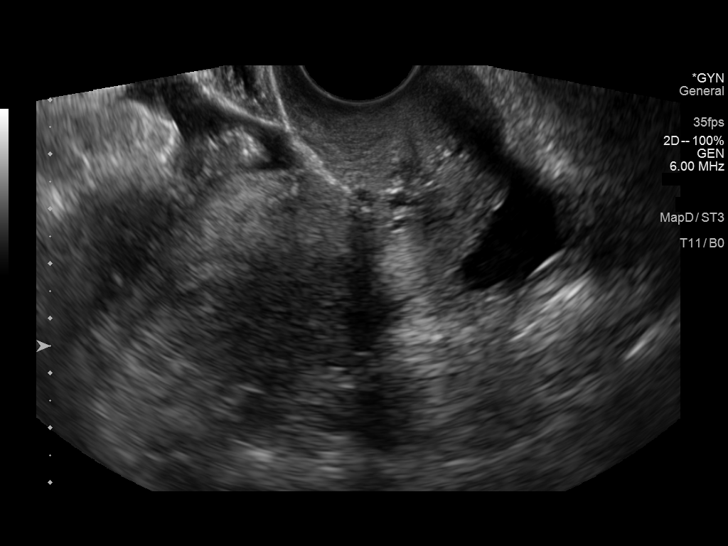
[im 37/69]
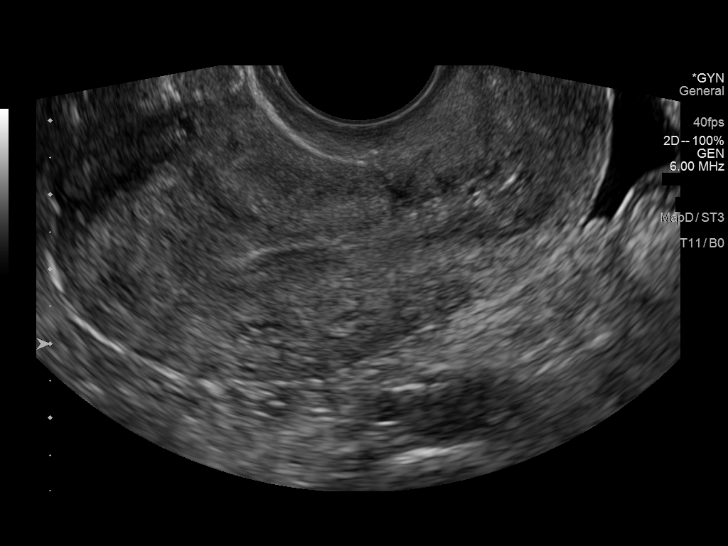
[im 43/69]
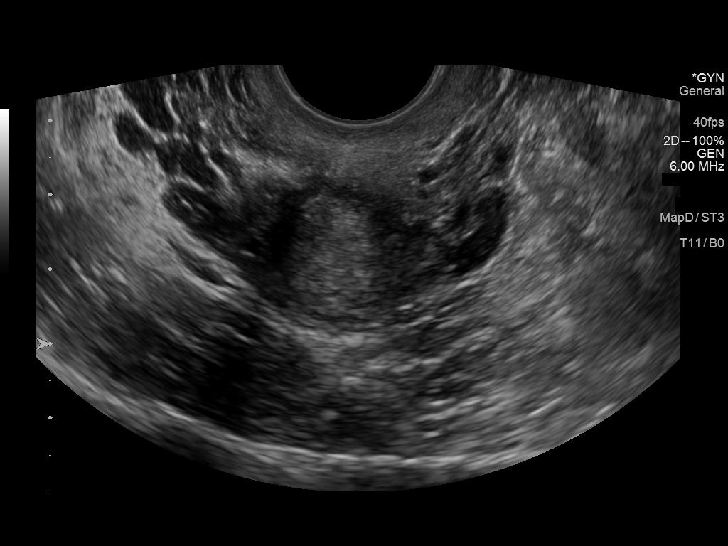
[im 46/69]
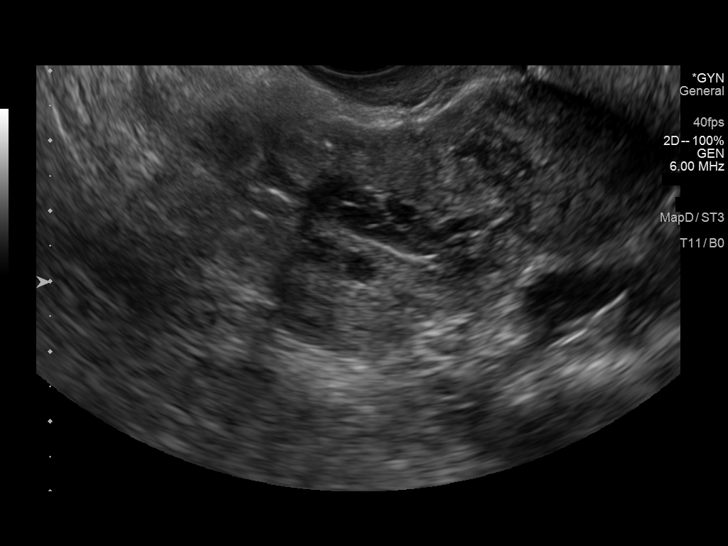
[im 52/69]
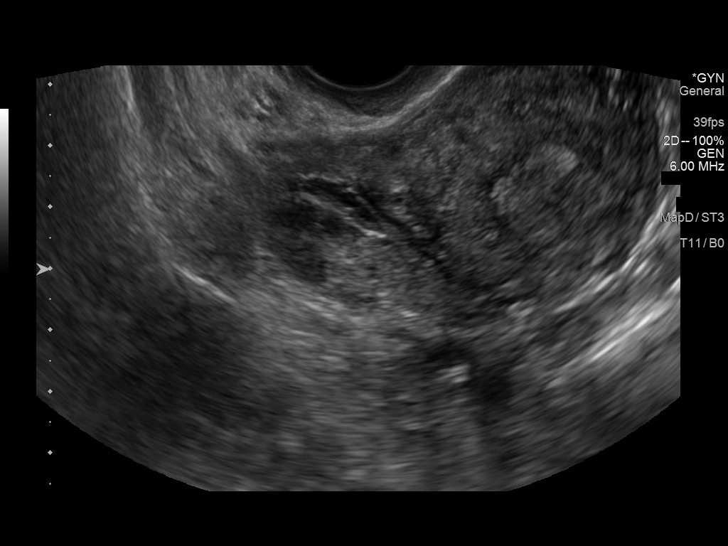
[im 57/69]
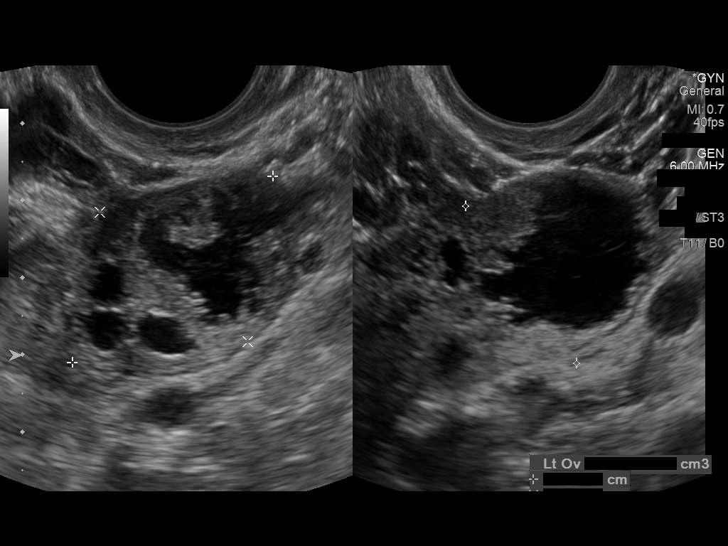
[im 63/69]
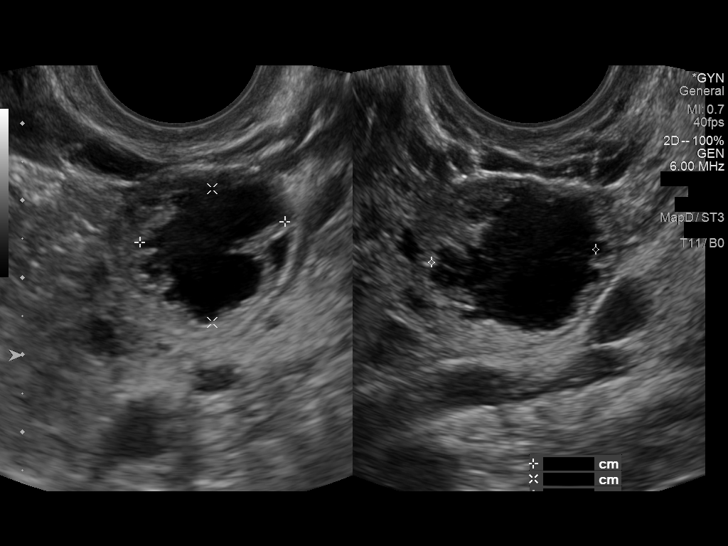
[im 69/69]
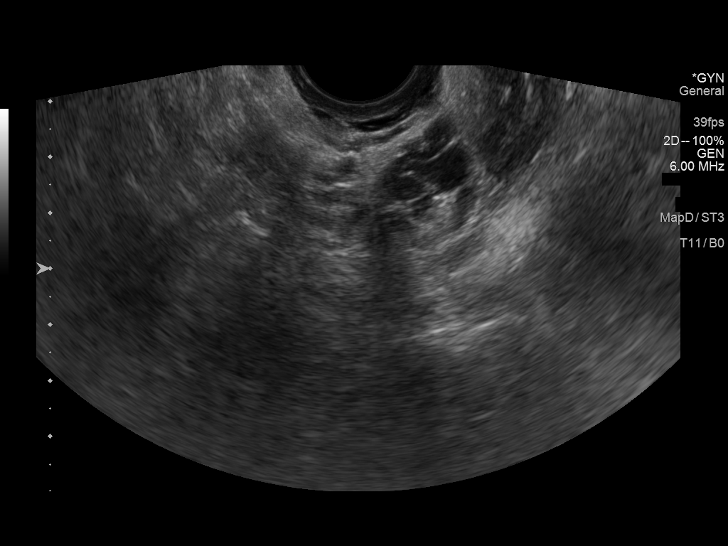

[14 of 25 positions shown; findings below may reference images not displayed]

FINDINGS: Uterus

Measurements: 49.8 x 4.2 x 4.17 = volume: 88 mL. No fibroids or
other mass visualized.

Endometrium

Thickness: 8 mm.  No focal abnormality visualized.

Right ovary

Measurements: 1.6 x 2.3 x 2.1 cm = volume: 5 mL. Normal
appearance/no adnexal mass.

Left ovary

Measurements: 3.5 x 2.5 x 2.5 cm = volume: 12 mL. There is a
probable collapsed hemorrhagic cyst measuring 1.9 cm.

Other findings

No abnormal free fluid.
IMPRESSION: No acute abnormality. No findings to explain the patient's symptoms.
